# Patient Record
Sex: Male | Born: 1990 | Race: White | Hispanic: No | Marital: Single | Smoking: Never smoker
Health system: Southern US, Community
[De-identification: ages and names within clinical notes are randomized; demographics above are authoritative.]

---
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
## 2013-06-19 ENCOUNTER — Emergency Department (HOSPITAL_BASED_OUTPATIENT_CLINIC_OR_DEPARTMENT_OTHER)
Admission: EM | Admit: 2013-06-19 | Discharge: 2013-06-19 | Disposition: A | Discharge status: Home / Self Care | Payer: BC Managed Care – PPO | Attending: Emergency Medicine | Admitting: Emergency Medicine

## 2013-06-19 ENCOUNTER — Encounter (HOSPITAL_BASED_OUTPATIENT_CLINIC_OR_DEPARTMENT_OTHER): Payer: Self-pay | Admitting: *Deleted

## 2013-06-19 DIAGNOSIS — Y9301 Activity, walking, marching and hiking: Secondary | ICD-10-CM | POA: Insufficient documentation

## 2013-06-19 DIAGNOSIS — W260XXA Contact with knife, initial encounter: Secondary | ICD-10-CM | POA: Insufficient documentation

## 2013-06-19 DIAGNOSIS — Y929 Unspecified place or not applicable: Secondary | ICD-10-CM | POA: Insufficient documentation

## 2013-06-19 DIAGNOSIS — S81009A Unspecified open wound, unspecified knee, initial encounter: Secondary | ICD-10-CM | POA: Insufficient documentation

## 2013-06-19 DIAGNOSIS — S91009A Unspecified open wound, unspecified ankle, initial encounter: Secondary | ICD-10-CM | POA: Insufficient documentation

## 2013-06-19 DIAGNOSIS — IMO0002 Reserved for concepts with insufficient information to code with codable children: Secondary | ICD-10-CM
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
## 2014-12-13 ENCOUNTER — Other Ambulatory Visit (HOSPITAL_COMMUNITY): Payer: Self-pay | Admitting: Psychiatry

## 2015-01-04 NOTE — Telephone Encounter (Signed)
Patient has never been seen in this practice and no record of appointments with Dr. Donell BeersPlovsky so refill denied and pharmacy notified through e-scribe [facility].
